# Patient Record
Sex: Female | Born: 1945 | Race: White | Hispanic: No | Marital: Married
Health system: Southern US, Community
[De-identification: ages and names within clinical notes are randomized; demographics above are authoritative.]

---

## 2014-03-07 ENCOUNTER — Other Ambulatory Visit (HOSPITAL_COMMUNITY): Payer: Self-pay

## 2014-03-07 ENCOUNTER — Inpatient Hospital Stay
Admission: AD | Admit: 2014-03-07 | Discharge: 2014-03-28 | Disposition: A | Discharge status: Skilled Nursing Facility | Payer: Self-pay | Source: Ambulatory Visit | Attending: Internal Medicine | Admitting: Internal Medicine

## 2014-03-07 DIAGNOSIS — R1310 Dysphagia, unspecified: Secondary | ICD-10-CM

## 2014-03-07 DIAGNOSIS — E46 Unspecified protein-calorie malnutrition: Secondary | ICD-10-CM

## 2014-03-07 LAB — COMPREHENSIVE METABOLIC PANEL
ALT: 58 U/L — AB (ref 0–35)
AST: 45 U/L — ABNORMAL HIGH (ref 0–37)
Albumin: 3.1 g/dL — ABNORMAL LOW (ref 3.5–5.2)
Alkaline Phosphatase: 239 U/L — ABNORMAL HIGH (ref 39–117)
BUN: 30 mg/dL — AB (ref 6–23)
CALCIUM: 9.8 mg/dL (ref 8.4–10.5)
CO2: 35 meq/L — AB (ref 19–32)
Chloride: 98 mEq/L (ref 96–112)
Creatinine, Ser: 0.44 mg/dL — ABNORMAL LOW (ref 0.50–1.10)
GLUCOSE: 117 mg/dL — AB (ref 70–99)
Potassium: 4.1 mEq/L (ref 3.7–5.3)
SODIUM: 144 meq/L (ref 137–147)
TOTAL PROTEIN: 7.1 g/dL (ref 6.0–8.3)
Total Bilirubin: 1.4 mg/dL — ABNORMAL HIGH (ref 0.3–1.2)

## 2014-03-07 LAB — PROTIME-INR
INR: 1.15 (ref 0.00–1.49)
PROTHROMBIN TIME: 14.5 s (ref 11.6–15.2)

## 2014-03-07 LAB — CBC WITH DIFFERENTIAL/PLATELET
Basophils Absolute: 0 10*3/uL (ref 0.0–0.1)
Basophils Relative: 0 % (ref 0–1)
EOS PCT: 1 % (ref 0–5)
Eosinophils Absolute: 0.1 10*3/uL (ref 0.0–0.7)
HEMATOCRIT: 45.7 % (ref 36.0–46.0)
Hemoglobin: 15.2 g/dL — ABNORMAL HIGH (ref 12.0–15.0)
LYMPHS ABS: 1.2 10*3/uL (ref 0.7–4.0)
Lymphocytes Relative: 13 % (ref 12–46)
MCH: 29 pg (ref 26.0–34.0)
MCHC: 33.3 g/dL (ref 30.0–36.0)
MCV: 87.2 fL (ref 78.0–100.0)
Monocytes Absolute: 0.8 10*3/uL (ref 0.1–1.0)
Monocytes Relative: 9 % (ref 3–12)
Neutro Abs: 7.2 10*3/uL (ref 1.7–7.7)
Neutrophils Relative %: 77 % (ref 43–77)
PLATELETS: 226 10*3/uL (ref 150–400)
RBC: 5.24 MIL/uL — AB (ref 3.87–5.11)
RDW: 17.9 % — ABNORMAL HIGH (ref 11.5–15.5)
WBC: 9.3 10*3/uL (ref 4.0–10.5)

## 2014-03-07 LAB — MAGNESIUM: Magnesium: 2.5 mg/dL (ref 1.5–2.5)

## 2014-03-07 LAB — APTT: aPTT: 25 seconds (ref 24–37)

## 2014-03-07 LAB — PHOSPHORUS: Phosphorus: 3 mg/dL (ref 2.3–4.6)

## 2014-03-07 LAB — PROCALCITONIN: PROCALCITONIN: 0.14 ng/mL

## 2014-03-07 LAB — PRO B NATRIURETIC PEPTIDE: PRO B NATRI PEPTIDE: 2892 pg/mL — AB (ref 0–125)

## 2014-03-07 LAB — TSH: TSH: 0.064 u[IU]/mL — AB (ref 0.350–4.500)

## 2014-03-08 LAB — HEMOGLOBIN A1C
Hgb A1c MFr Bld: 6.8 % — ABNORMAL HIGH (ref <5.7)
Mean Plasma Glucose: 148 mg/dL — ABNORMAL HIGH (ref <117)

## 2014-03-08 LAB — T4, FREE: FREE T4: 1.51 ng/dL (ref 0.80–1.80)

## 2014-03-08 LAB — PREALBUMIN: PREALBUMIN: 19.8 mg/dL (ref 17.0–34.0)

## 2014-03-10 LAB — BASIC METABOLIC PANEL
BUN: 23 mg/dL (ref 6–23)
CALCIUM: 8.8 mg/dL (ref 8.4–10.5)
CO2: 36 meq/L — AB (ref 19–32)
Chloride: 100 mEq/L (ref 96–112)
Creatinine, Ser: 0.39 mg/dL — ABNORMAL LOW (ref 0.50–1.10)
GFR calc Af Amer: >90 mL/min (ref >90)
GFR calc non Af Amer: >90 mL/min (ref >90)
Glucose, Bld: 157 mg/dL — ABNORMAL HIGH (ref 70–99)
Potassium: 3.6 mEq/L — ABNORMAL LOW (ref 3.7–5.3)
Sodium: 146 mEq/L (ref 137–147)

## 2014-03-10 LAB — CBC
HCT: 41.8 % (ref 36.0–46.0)
Hemoglobin: 13.5 g/dL (ref 12.0–15.0)
MCH: 28.4 pg (ref 26.0–34.0)
MCHC: 32.3 g/dL (ref 30.0–36.0)
MCV: 88 fL (ref 78.0–100.0)
Platelets: 164 10*3/uL (ref 150–400)
RBC: 4.75 MIL/uL (ref 3.87–5.11)
RDW: 17.8 % — AB (ref 11.5–15.5)
WBC: 9.4 10*3/uL (ref 4.0–10.5)

## 2014-03-11 ENCOUNTER — Other Ambulatory Visit (HOSPITAL_COMMUNITY): Payer: Self-pay

## 2014-03-11 LAB — URINALYSIS, ROUTINE W REFLEX MICROSCOPIC
BILIRUBIN URINE: NEGATIVE
Glucose, UA: NEGATIVE mg/dL
Ketones, ur: NEGATIVE mg/dL
NITRITE: NEGATIVE
PH: 7 (ref 5.0–8.0)
Protein, ur: NEGATIVE mg/dL
Specific Gravity, Urine: 1.014 (ref 1.005–1.030)
Urobilinogen, UA: 1 mg/dL (ref 0.0–1.0)

## 2014-03-11 LAB — URINE MICROSCOPIC-ADD ON

## 2014-03-11 LAB — POTASSIUM: Potassium: 3.6 mEq/L — ABNORMAL LOW (ref 3.7–5.3)

## 2014-03-12 ENCOUNTER — Other Ambulatory Visit (HOSPITAL_COMMUNITY): Payer: Self-pay

## 2014-03-12 LAB — CBC WITH DIFFERENTIAL/PLATELET
Basophils Absolute: 0 10*3/uL (ref 0.0–0.1)
Basophils Relative: 1 % (ref 0–1)
Eosinophils Absolute: 0.1 10*3/uL (ref 0.0–0.7)
Eosinophils Relative: 1 % (ref 0–5)
HCT: 43.6 % (ref 36.0–46.0)
HEMOGLOBIN: 13.7 g/dL (ref 12.0–15.0)
Lymphocytes Relative: 15 % (ref 12–46)
Lymphs Abs: 1.2 10*3/uL (ref 0.7–4.0)
MCH: 28.2 pg (ref 26.0–34.0)
MCHC: 31.4 g/dL (ref 30.0–36.0)
MCV: 89.7 fL (ref 78.0–100.0)
MONOS PCT: 10 % (ref 3–12)
Monocytes Absolute: 0.8 10*3/uL (ref 0.1–1.0)
Neutro Abs: 6.3 10*3/uL (ref 1.7–7.7)
Neutrophils Relative %: 73 % (ref 43–77)
Platelets: 198 10*3/uL (ref 150–400)
RBC: 4.86 MIL/uL (ref 3.87–5.11)
RDW: 17.8 % — ABNORMAL HIGH (ref 11.5–15.5)
WBC: 8.4 10*3/uL (ref 4.0–10.5)

## 2014-03-12 LAB — BASIC METABOLIC PANEL
BUN: 28 mg/dL — AB (ref 6–23)
CALCIUM: 9 mg/dL (ref 8.4–10.5)
CO2: 36 mEq/L — ABNORMAL HIGH (ref 19–32)
Chloride: 97 mEq/L (ref 96–112)
Creatinine, Ser: 0.47 mg/dL — ABNORMAL LOW (ref 0.50–1.10)
GFR calc Af Amer: >90 mL/min (ref >90)
GLUCOSE: 171 mg/dL — AB (ref 70–99)
Potassium: 4.6 mEq/L (ref 3.7–5.3)
Sodium: 142 mEq/L (ref 137–147)

## 2014-03-12 LAB — PROCALCITONIN: Procalcitonin: <0.1 ng/mL

## 2014-03-13 ENCOUNTER — Other Ambulatory Visit (HOSPITAL_COMMUNITY): Payer: Self-pay

## 2014-03-13 LAB — URINE CULTURE: Colony Count: >=100000

## 2014-03-14 ENCOUNTER — Other Ambulatory Visit (HOSPITAL_COMMUNITY): Payer: Self-pay

## 2014-03-14 LAB — BASIC METABOLIC PANEL
BUN: 28 mg/dL — AB (ref 6–23)
BUN: 29 mg/dL — AB (ref 6–23)
CALCIUM: 9.1 mg/dL (ref 8.4–10.5)
CHLORIDE: 94 meq/L — AB (ref 96–112)
CO2: 36 mEq/L — ABNORMAL HIGH (ref 19–32)
CO2: 34 mEq/L — ABNORMAL HIGH (ref 19–32)
CREATININE: 0.53 mg/dL (ref 0.50–1.10)
Calcium: 9.4 mg/dL (ref 8.4–10.5)
Chloride: 94 mEq/L — ABNORMAL LOW (ref 96–112)
Creatinine, Ser: 0.45 mg/dL — ABNORMAL LOW (ref 0.50–1.10)
GFR calc Af Amer: >90 mL/min (ref >90)
GFR calc Af Amer: >90 mL/min (ref >90)
GFR calc non Af Amer: >90 mL/min (ref >90)
GFR calc non Af Amer: >90 mL/min (ref >90)
GLUCOSE: 94 mg/dL (ref 70–99)
Glucose, Bld: 216 mg/dL — ABNORMAL HIGH (ref 70–99)
POTASSIUM: 5.4 meq/L — AB (ref 3.7–5.3)
Potassium: 4.2 mEq/L (ref 3.7–5.3)
Sodium: 141 mEq/L (ref 137–147)
Sodium: 140 mEq/L (ref 137–147)

## 2014-03-16 LAB — PRO B NATRIURETIC PEPTIDE: PRO B NATRI PEPTIDE: 3756 pg/mL — AB (ref 0–125)

## 2014-03-16 LAB — CBC
HCT: 43.5 % (ref 36.0–46.0)
HEMOGLOBIN: 13.6 g/dL (ref 12.0–15.0)
MCH: 27.8 pg (ref 26.0–34.0)
MCHC: 31.3 g/dL (ref 30.0–36.0)
MCV: 88.8 fL (ref 78.0–100.0)
PLATELETS: 225 10*3/uL (ref 150–400)
RBC: 4.9 MIL/uL (ref 3.87–5.11)
RDW: 17.8 % — ABNORMAL HIGH (ref 11.5–15.5)
WBC: 6.8 10*3/uL (ref 4.0–10.5)

## 2014-03-16 LAB — BASIC METABOLIC PANEL
BUN: 23 mg/dL (ref 6–23)
CALCIUM: 9.5 mg/dL (ref 8.4–10.5)
CO2: 32 meq/L (ref 19–32)
Chloride: 97 mEq/L (ref 96–112)
Creatinine, Ser: 0.65 mg/dL (ref 0.50–1.10)
GFR calc Af Amer: >90 mL/min (ref >90)
GFR calc non Af Amer: >90 mL/min (ref >90)
Glucose, Bld: 111 mg/dL — ABNORMAL HIGH (ref 70–99)
Potassium: 3.9 mEq/L (ref 3.7–5.3)
Sodium: 144 mEq/L (ref 137–147)

## 2014-03-16 LAB — PHOSPHORUS: Phosphorus: 3.4 mg/dL (ref 2.3–4.6)

## 2014-03-16 LAB — MAGNESIUM: Magnesium: 2.1 mg/dL (ref 1.5–2.5)

## 2014-03-18 LAB — PREALBUMIN: Prealbumin: 14.7 mg/dL — ABNORMAL LOW (ref 17.0–34.0)

## 2014-03-18 LAB — CBC WITH DIFFERENTIAL/PLATELET
BASOS ABS: 0 10*3/uL (ref 0.0–0.1)
BASOS PCT: 0 % (ref 0–1)
Eosinophils Absolute: 0.1 10*3/uL (ref 0.0–0.7)
Eosinophils Relative: 1 % (ref 0–5)
HCT: 43.2 % (ref 36.0–46.0)
Hemoglobin: 13.5 g/dL (ref 12.0–15.0)
LYMPHS PCT: 18 % (ref 12–46)
Lymphs Abs: 1 10*3/uL (ref 0.7–4.0)
MCH: 27.7 pg (ref 26.0–34.0)
MCHC: 31.3 g/dL (ref 30.0–36.0)
MCV: 88.7 fL (ref 78.0–100.0)
MONOS PCT: 12 % (ref 3–12)
Monocytes Absolute: 0.7 10*3/uL (ref 0.1–1.0)
NEUTROS ABS: 3.8 10*3/uL (ref 1.7–7.7)
NEUTROS PCT: 69 % (ref 43–77)
Platelets: 222 10*3/uL (ref 150–400)
RBC: 4.87 MIL/uL (ref 3.87–5.11)
RDW: 18.3 % — ABNORMAL HIGH (ref 11.5–15.5)
WBC: 5.6 10*3/uL (ref 4.0–10.5)

## 2014-03-18 LAB — BASIC METABOLIC PANEL
BUN: 16 mg/dL (ref 6–23)
CHLORIDE: 96 meq/L (ref 96–112)
CO2: 34 mEq/L — ABNORMAL HIGH (ref 19–32)
Calcium: 9.1 mg/dL (ref 8.4–10.5)
Creatinine, Ser: 0.64 mg/dL (ref 0.50–1.10)
Glucose, Bld: 104 mg/dL — ABNORMAL HIGH (ref 70–99)
Potassium: 3.4 mEq/L — ABNORMAL LOW (ref 3.7–5.3)
SODIUM: 143 meq/L (ref 137–147)

## 2014-03-18 LAB — MAGNESIUM: MAGNESIUM: 2.1 mg/dL (ref 1.5–2.5)

## 2014-03-18 LAB — PHOSPHORUS: PHOSPHORUS: 3.2 mg/dL (ref 2.3–4.6)

## 2014-03-19 LAB — BASIC METABOLIC PANEL
BUN: 14 mg/dL (ref 6–23)
CALCIUM: 9.5 mg/dL (ref 8.4–10.5)
CO2: 35 mEq/L — ABNORMAL HIGH (ref 19–32)
Chloride: 97 mEq/L (ref 96–112)
Creatinine, Ser: 0.6 mg/dL (ref 0.50–1.10)
GFR calc non Af Amer: >90 mL/min (ref >90)
Glucose, Bld: 92 mg/dL (ref 70–99)
Potassium: 3.7 mEq/L (ref 3.7–5.3)
SODIUM: 144 meq/L (ref 137–147)

## 2014-03-21 ENCOUNTER — Other Ambulatory Visit (HOSPITAL_COMMUNITY): Payer: Self-pay

## 2014-03-21 LAB — URINALYSIS, ROUTINE W REFLEX MICROSCOPIC
BILIRUBIN URINE: NEGATIVE
Glucose, UA: NEGATIVE mg/dL
HGB URINE DIPSTICK: NEGATIVE
Ketones, ur: NEGATIVE mg/dL
Nitrite: NEGATIVE
Protein, ur: NEGATIVE mg/dL
Specific Gravity, Urine: 1.023 (ref 1.005–1.030)
Urobilinogen, UA: 1 mg/dL (ref 0.0–1.0)
pH: 6 (ref 5.0–8.0)

## 2014-03-21 LAB — CBC WITH DIFFERENTIAL/PLATELET
BASOS ABS: 0 10*3/uL (ref 0.0–0.1)
Basophils Relative: 0 % (ref 0–1)
Eosinophils Absolute: 0.1 10*3/uL (ref 0.0–0.7)
Eosinophils Relative: 1 % (ref 0–5)
HCT: 44.7 % (ref 36.0–46.0)
Hemoglobin: 14.9 g/dL (ref 12.0–15.0)
LYMPHS PCT: 9 % — AB (ref 12–46)
Lymphs Abs: 1 10*3/uL (ref 0.7–4.0)
MCH: 28.9 pg (ref 26.0–34.0)
MCHC: 33.3 g/dL (ref 30.0–36.0)
MCV: 86.6 fL (ref 78.0–100.0)
MONOS PCT: 9 % (ref 3–12)
Monocytes Absolute: 1 10*3/uL (ref 0.1–1.0)
NEUTROS PCT: 81 % — AB (ref 43–77)
Neutro Abs: 9.3 10*3/uL — ABNORMAL HIGH (ref 1.7–7.7)
PLATELETS: 160 10*3/uL (ref 150–400)
RBC: 5.16 MIL/uL — ABNORMAL HIGH (ref 3.87–5.11)
RDW: 18.8 % — ABNORMAL HIGH (ref 11.5–15.5)
WBC: 11.4 10*3/uL — AB (ref 4.0–10.5)

## 2014-03-21 LAB — BASIC METABOLIC PANEL
BUN: 11 mg/dL (ref 6–23)
CHLORIDE: 95 meq/L — AB (ref 96–112)
CO2: 34 mEq/L — ABNORMAL HIGH (ref 19–32)
CREATININE: 0.52 mg/dL (ref 0.50–1.10)
Calcium: 9.2 mg/dL (ref 8.4–10.5)
GFR calc non Af Amer: >90 mL/min (ref >90)
Glucose, Bld: 92 mg/dL (ref 70–99)
POTASSIUM: 4.6 meq/L (ref 3.7–5.3)
Sodium: 141 mEq/L (ref 137–147)

## 2014-03-21 LAB — URINE MICROSCOPIC-ADD ON

## 2014-03-21 LAB — PHOSPHORUS: Phosphorus: 2.4 mg/dL (ref 2.3–4.6)

## 2014-03-21 LAB — PRO B NATRIURETIC PEPTIDE: PRO B NATRI PEPTIDE: 2481 pg/mL — AB (ref 0–125)

## 2014-03-21 LAB — MAGNESIUM: Magnesium: 2.1 mg/dL (ref 1.5–2.5)

## 2014-03-22 LAB — BASIC METABOLIC PANEL
BUN: 14 mg/dL (ref 6–23)
CO2: 31 mEq/L (ref 19–32)
Calcium: 9.1 mg/dL (ref 8.4–10.5)
Chloride: 95 mEq/L — ABNORMAL LOW (ref 96–112)
Creatinine, Ser: 0.46 mg/dL — ABNORMAL LOW (ref 0.50–1.10)
GFR calc Af Amer: >90 mL/min (ref >90)
GFR calc non Af Amer: >90 mL/min (ref >90)
GLUCOSE: 85 mg/dL (ref 70–99)
POTASSIUM: 3.8 meq/L (ref 3.7–5.3)
Sodium: 138 mEq/L (ref 137–147)

## 2014-03-22 LAB — CBC
HCT: 43.3 % (ref 36.0–46.0)
HEMOGLOBIN: 14.2 g/dL (ref 12.0–15.0)
MCH: 28.5 pg (ref 26.0–34.0)
MCHC: 32.8 g/dL (ref 30.0–36.0)
MCV: 86.8 fL (ref 78.0–100.0)
Platelets: 164 10*3/uL (ref 150–400)
RBC: 4.99 MIL/uL (ref 3.87–5.11)
RDW: 19.1 % — ABNORMAL HIGH (ref 11.5–15.5)
WBC: 9.7 10*3/uL (ref 4.0–10.5)

## 2014-03-22 LAB — PROTIME-INR
INR: 1.21 (ref 0.00–1.49)
Prothrombin Time: 15 seconds (ref 11.6–15.2)

## 2014-03-23 LAB — URINE CULTURE: Colony Count: 50000

## 2014-03-23 NOTE — Progress Notes (Signed)
Patient ID: Jade Morgan, female   DOB: [birth year], 68 y.o.   MRN: [id number] Request received for percutaneous gastrostomy tube placement in pt with history of PCM, dysphagia. Additional PMH includes CM,CHF, EF 20-25%, A fib,? narcolepsy,  HTN, remote CVA, GI bleed in past- hemorrhoids per pt, ICD placement, prior hyst., appy, cholecystectomy. Imaging studies were reviewed by Dr. Bonnielee Haff. Exam: pt awake, responds appropriately to questions; chest- dim BS bases, right greater than left; left chest wall ICD; heart- reg at present; abd- soft,+BS,NT; ext- edema bilat LE.  Vitals: BP 110/57  HR 62  R 18 TEMP 98.6  O2 SATS 98% RA                                                                                                     Dg Chest Port 1 View  03/21/2014   CLINICAL DATA:  Pneumonia.  EXAM: PORTABLE CHEST - 1 VIEW  COMPARISON:  Mar 07, 2014.  FINDINGS: Stable cardiomegaly. Left-sided pacemaker is unchanged in position. Mild left pleural effusion is noted. Moderate right pleural effusion is noted which is increased compared to prior exam. Underlying pneumonia or atelectasis cannot be excluded. No pneumothorax is noted. Bony thorax is intact.  IMPRESSION: Moderate right pleural effusion is noted which is increased compared to prior exam; underlying pneumonia or atelectasis cannot be excluded.   Electronically Signed   By: Roque Lias M.D.   On: 03/21/2014 12:43   Dg Chest Port 1 View  03/07/2014   CLINICAL DATA:  CHF.  EXAM: PORTABLE CHEST - 1 VIEW  COMPARISON:  None.  FINDINGS: Enlarged cardiac silhouette. Left subclavian pacer and AICD leads. Right jugular catheter tip in the region of the confluence of the innominate veins. Feeding tube extending into the upper abdomen. Mildly prominent interstitial markings and pulmonary vasculature. Minimal bilateral pleural thickening or fluid. Diffuse osteopenia.  IMPRESSION: 1. Cardiomegaly and pulmonary vascular congestion. 2. Mild chronic interstitial lung disease and  possible mild interstitial pulmonary edema. 3. Minimal bilateral pleural thickening or fluid. 4. Right jugular catheter tip in the region of the confluence of the innominate veins.   Electronically Signed   By: Gordan Payment M.D.   On: 03/07/2014 19:32   Dg Ankle Right Port  03/14/2014   CLINICAL DATA:  Pain at base of right great toe.  EXAM: PORTABLE RIGHT ANKLE - 2 VIEW  COMPARISON:  None.  FINDINGS: No acute fracture or dislocation is identified. Joint spaces are preserved. No lytic or blastic osseous lesion is seen. No focal soft tissue abnormality is identified.  IMPRESSION: Negative.   Electronically Signed   By: Sebastian Ache   On: 03/14/2014 17:03   Dg Abd Portable 1v  03/07/2014   CLINICAL DATA:  Confirmed NG placement  EXAM: PORTABLE ABDOMEN - 1 VIEW  COMPARISON:  None.  FINDINGS: Weighted feeding tube terminates in the proximal duodenum.  Cholecystectomy clips.  Cardiomegaly with left subclavian ICD.  IMPRESSION: Weighted feeding tube terminates in the proximal duodenum.   Electronically Signed   By: Charline Bills M.D.   On: 03/07/2014 20:59  Dg Foot Complete Right  03/14/2014   CLINICAL DATA:  Right great toe pain  EXAM: RIGHT FOOT COMPLETE - 3+ VIEW  COMPARISON:  None.  FINDINGS: Three views of the right foot submitted. No acute fracture or subluxation. Mild erosive degenerative changes noted distal medial aspect of first metatarsal.  IMPRESSION: No acute fracture or subluxation. 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   Electronically Signed   By: Natasha MeadLiviu  Pop M.D.   On: 03/14/2014 17:00  Results for orders placed during the hospital encounter of 03/07/14  URINE CULTURE      Result Value Ref Range   Specimen Description URINE, RANDOM     Special Requests NONE     Culture  Setup Time       Value: 03/11/2014 15:00     Performed at [facility]   Colony Count       Value: >=100,000 COLONIES/ML     [facility]formed at [facility]   Culture        Value: ENTEROCOCCUS SPECIES     Performed at [facility]   Report Status 03/13/2014 FINAL     Organism ID, Bacteria ENTEROCOCCUS SPECIES    URINE CULTURE      Result Value Ref Range   Specimen Description URINE, RANDOM     Special Requests ADDED [id number]     Culture  Setup Time       Value: 03/21/2014 21:35     Performed at [facility]   Colony Count       Value: 50,000 COLONIES/ML     Performed at [facility]   Culture       Value: YEAST     Performed at [facility]   Report Status 03/23/2014 FINAL    CBC WITH DIFFERENTIAL      Result Value Ref Range   WBC 9.3  4.0 - 10.5 K/uL   RBC 5.24 (*) 3.87 - 5.11 MIL/uL   Hemoglobin 15.2 (*) 12.0 - 15.0 g/dL   HCT 04.545.7  40.936.0 - 81.146.0 %   MCV 87.2  78.0 - 100.0 fL   MCH 29.0  26.0 - 34.0 pg   MCHC 33.3  30.0 - 36.0 g/dL   RDW 91.417.9 (*) 78.211.5 - 95.615.5 %   Platelets 226  150 - 400 K/uL   Neutrophils Relative % 77  43 - 77 %   Neutro Abs 7.2  1.7 - 7.7 K/uL   Lymphocytes Relative 13  12 - 46 %   Lymphs Abs 1.2  0.7 - 4.0 K/uL   Monocytes Relative 9  3 - 12 %   Monocytes Absolute 0.8  0.1 - 1.0 K/uL   Eosinophils Relative 1  0 - 5 %   Eosinophils Absolute 0.1  0.0 - 0.7 K/uL   Basophils Relative 0  0 - 1 %   Basophils Absolute 0.0  0.0 - 0.1 K/uL  COMPREHENSIVE METABOLIC PANEL      Result Value Ref Range   Sodium 144  137 - 147 mEq/L   Potassium 4.1  3.7 - 5.3 mEq/L   Chloride 98  96 - 112 mEq/L   CO2 35 (*) 19 - 32 mEq/L   Glucose, Bld 117 (*) 70 - 99 mg/dL   BUN 30 (*) 6 - 23 mg/dL   Creatinine, Ser 2.130.44 (*) 0.50 - 1.10 mg/dL   Calcium 9.8  8.4 - 08.610.5 mg/dL   Total Protein 7.1  6.0 - 8.3  g/dL   Albumin 3.1 (*) 3.5 - 5.2 g/dL   AST 45 (*) 0 - 37 U/L   ALT 58 (*) 0 - 35 U/L   Alkaline Phosphatase 239 (*) 39 - 117 U/L   Total Bilirubin 1.4 (*) 0.3 - 1.2 mg/dL   GFR calc non Af Amer >90  >90 mL/min   GFR calc Af Amer >90  >90 mL/min  MAGNESIUM      Result Value Ref Range   Magnesium 2.5  1.5  - 2.5 mg/dL  PHOSPHORUS      Result Value Ref Range   Phosphorus 3.0  2.3 - 4.6 mg/dL  PRO B NATRIURETIC PEPTIDE      Result Value Ref Range   Pro B Natriuretic peptide (BNP) 2892.0 (*) 0 - 125 pg/mL  PROTIME-INR      Result Value Ref Range   Prothrombin Time 14.5  11.6 - 15.2 seconds   INR 1.15  0.00 - 1.49  APTT      Result Value Ref Range   aPTT 25  24 - 37 seconds  PREALBUMIN      Result Value Ref Range   Prealbumin 19.8  17.0 - 34.0 mg/dL  HEMOGLOBIN Z6X      Result Value Ref Range   Hemoglobin A1C 6.8 (*) <5.7 %   Mean Plasma Glucose 148 (*) <117 mg/dL  TSH      Result Value Ref Range   TSH 0.064 (*) 0.350 - 4.500 uIU/mL  T4, FREE      Result Value Ref Range   Free T4 1.51  0.80 - 1.80 ng/dL  PROCALCITONIN      Result Value Ref Range   Procalcitonin 0.14    CBC      Result Value Ref Range   WBC 9.4  4.0 - 10.5 K/uL   RBC 4.75  3.87 - 5.11 MIL/uL   Hemoglobin 13.5  12.0 - 15.0 g/dL   HCT 09.6  04.5 - 40.9 %   MCV 88.0  78.0 - 100.0 fL   MCH 28.4  26.0 - 34.0 pg   MCHC 32.3  30.0 - 36.0 g/dL   RDW 81.1 (*) 91.4 - 78.2 %   Platelets 164  150 - 400 K/uL  BASIC METABOLIC PANEL      Result Value Ref Range   Sodium 146  137 - 147 mEq/L   Potassium 3.6 (*) 3.7 - 5.3 mEq/L   Chloride 100  96 - 112 mEq/L   CO2 36 (*) 19 - 32 mEq/L   Glucose, Bld 157 (*) 70 - 99 mg/dL   BUN 23  6 - 23 mg/dL   Creatinine, Ser 9.56 (*) 0.50 - 1.10 mg/dL   Calcium 8.8  8.4 - 21.3 mg/dL   GFR calc non Af Amer >90  >90 mL/min   GFR calc Af Amer >90  >90 mL/min  POTASSIUM      Result Value Ref Range   Potassium 3.6 (*) 3.7 - 5.3 mEq/L  URINALYSIS, ROUTINE W REFLEX MICROSCOPIC      Result Value Ref Range   Color, Urine YELLOW  YELLOW   APPearance CLOUDY (*) CLEAR   Specific Gravity, Urine 1.014  1.005 - 1.030   pH 7.0  5.0 - 8.0   Glucose, UA NEGATIVE  NEGATIVE mg/dL   Hgb urine dipstick LARGE (*) NEGATIVE   Bilirubin Urine NEGATIVE  NEGATIVE   Ketones, ur NEGATIVE  NEGATIVE mg/dL    Protein, ur NEGATIVE  NEGATIVE mg/dL  Urobilinogen, UA 1.0  0.0 - 1.0 mg/dL   Nitrite NEGATIVE  NEGATIVE   Leukocytes, UA MODERATE (*) NEGATIVE  URINE MICROSCOPIC-ADD ON      Result Value Ref Range   Squamous Epithelial / LPF RARE  RARE   WBC, UA 7-10  <3 WBC/hpf   RBC / HPF 21-50  <3 RBC/hpf   Bacteria, UA MANY (*) RARE   Casts HYALINE CASTS (*) NEGATIVE  CBC WITH DIFFERENTIAL      Result Value Ref Range   WBC 8.4  4.0 - 10.5 K/uL   RBC 4.86  3.87 - 5.11 MIL/uL   Hemoglobin 13.7  12.0 - 15.0 g/dL   HCT 16.143.6  09.636.0 - 04.546.0 %   MCV 89.7  78.0 - 100.0 fL   MCH 28.2  26.0 - 34.0 pg   MCHC 31.4  30.0 - 36.0 g/dL   RDW 40.917.8 (*) 81.111.5 - 91.415.5 %   Platelets 198  150 - 400 K/uL   Neutrophils Relative % 73  43 - 77 %   Neutro Abs 6.3  1.7 - 7.7 K/uL   Lymphocytes Relative 15  12 - 46 %   Lymphs Abs 1.2  0.7 - 4.0 K/uL   Monocytes Relative 10  3 - 12 %   Monocytes Absolute 0.8  0.1 - 1.0 K/uL   Eosinophils Relative 1  0 - 5 %   Eosinophils Absolute 0.1  0.0 - 0.7 K/uL   Basophils Relative 1  0 - 1 %   Basophils Absolute 0.0  0.0 - 0.1 K/uL  BASIC METABOLIC PANEL      Result Value Ref Range   Sodium 142  137 - 147 mEq/L   Potassium 4.6  3.7 - 5.3 mEq/L   Chloride 97  96 - 112 mEq/L   CO2 36 (*) 19 - 32 mEq/L   Glucose, Bld 171 (*) 70 - 99 mg/dL   BUN 28 (*) 6 - 23 mg/dL   Creatinine, Ser 7.820.47 (*) 0.50 - 1.10 mg/dL   Calcium 9.0  8.4 - 95.610.5 mg/dL   GFR calc non Af Amer >90  >90 mL/min   GFR calc Af Amer >90  >90 mL/min  PROCALCITONIN      Result Value Ref Range   Procalcitonin <0.10    BASIC METABOLIC PANEL      Result Value Ref Range   Sodium 141  137 - 147 mEq/L   Potassium 5.4 (*) 3.7 - 5.3 mEq/L   Chloride 94 (*) 96 - 112 mEq/L   CO2 36 (*) 19 - 32 mEq/L   Glucose, Bld 94  70 - 99 mg/dL   BUN 28 (*) 6 - 23 mg/dL   Creatinine, Ser 2.130.45 (*) 0.50 - 1.10 mg/dL   Calcium 9.1  8.4 - 08.610.5 mg/dL   GFR calc non Af Amer >90  >90 mL/min   GFR calc Af Amer >90  >90 mL/min   BASIC METABOLIC PANEL      Result Value Ref Range   Sodium 140  137 - 147 mEq/L   Potassium 4.2  3.7 - 5.3 mEq/L   Chloride 94 (*) 96 - 112 mEq/L   CO2 34 (*) 19 - 32 mEq/L   Glucose, Bld 216 (*) 70 - 99 mg/dL   BUN 29 (*) 6 - 23 mg/dL   Creatinine, Ser 5.780.53  0.50 - 1.10 mg/dL   Calcium 9.4  8.4 - 46.910.5 mg/dL   GFR calc non Af Amer >90  >90  mL/min   GFR calc Af Amer >90  >90 mL/min  CBC      Result Value Ref Range   WBC 6.8  4.0 - 10.5 K/uL   RBC 4.90  3.87 - 5.11 MIL/uL   Hemoglobin 13.6  12.0 - 15.0 g/dL   HCT 16.1  09.6 - 04.5 %   MCV 88.8  78.0 - 100.0 fL   MCH 27.8  26.0 - 34.0 pg   MCHC 31.3  30.0 - 36.0 g/dL   RDW 40.9 (*) 81.1 - 91.4 %   Platelets 225  150 - 400 K/uL  BASIC METABOLIC PANEL      Result Value Ref Range   Sodium 144  137 - 147 mEq/L   Potassium 3.9  3.7 - 5.3 mEq/L   Chloride 97  96 - 112 mEq/L   CO2 32  19 - 32 mEq/L   Glucose, Bld 111 (*) 70 - 99 mg/dL   BUN 23  6 - 23 mg/dL   Creatinine, Ser 7.82  0.50 - 1.10 mg/dL   Calcium 9.5  8.4 - 95.6 mg/dL   GFR calc non Af Amer >90  >90 mL/min   GFR calc Af Amer >90  >90 mL/min  PRO B NATRIURETIC PEPTIDE      Result Value Ref Range   Pro B Natriuretic peptide (BNP) 3756.0 (*) 0 - 125 pg/mL  MAGNESIUM      Result Value Ref Range   Magnesium 2.1  1.5 - 2.5 mg/dL  PHOSPHORUS      Result Value Ref Range   Phosphorus 3.4  2.3 - 4.6 mg/dL  BASIC METABOLIC PANEL      Result Value Ref Range   Sodium 143  137 - 147 mEq/L   Potassium 3.4 (*) 3.7 - 5.3 mEq/L   Chloride 96  96 - 112 mEq/L   CO2 34 (*) 19 - 32 mEq/L   Glucose, Bld 104 (*) 70 - 99 mg/dL   BUN 16  6 - 23 mg/dL   Creatinine, Ser 2.13  0.50 - 1.10 mg/dL   Calcium 9.1  8.4 - 08.6 mg/dL   GFR calc non Af Amer >90  >90 mL/min   GFR calc Af Amer >90  >90 mL/min  CBC WITH DIFFERENTIAL      Result Value Ref Range   WBC 5.6  4.0 - 10.5 K/uL   RBC 4.87  3.87 - 5.11 MIL/uL   Hemoglobin 13.5  12.0 - 15.0 g/dL   HCT 57.8  46.9 - 62.9 %   MCV 88.7   78.0 - 100.0 fL   MCH 27.7  26.0 - 34.0 pg   MCHC 31.3  30.0 - 36.0 g/dL   RDW 52.8 (*) 41.3 - 24.4 %   Platelets 222  150 - 400 K/uL   Neutrophils Relative % 69  43 - 77 %   Neutro Abs 3.8  1.7 - 7.7 K/uL   Lymphocytes Relative 18  12 - 46 %   Lymphs Abs 1.0  0.7 - 4.0 K/uL   Monocytes Relative 12  3 - 12 %   Monocytes Absolute 0.7  0.1 - 1.0 K/uL   Eosinophils Relative 1  0 - 5 %   Eosinophils Absolute 0.1  0.0 - 0.7 K/uL   Basophils Relative 0  0 - 1 %   Basophils Absolute 0.0  0.0 - 0.1 K/uL  PREALBUMIN      Result Value Ref Range   Prealbumin 14.7 (*) 17.0 -  34.0 mg/dL  MAGNESIUM      Result Value Ref Range   Magnesium 2.1  1.5 - 2.5 mg/dL  PHOSPHORUS      Result Value Ref Range   Phosphorus 3.2  2.3 - 4.6 mg/dL  BASIC METABOLIC PANEL      Result Value Ref Range   Sodium 144  137 - 147 mEq/L   Potassium 3.7  3.7 - 5.3 mEq/L   Chloride 97  96 - 112 mEq/L   CO2 35 (*) 19 - 32 mEq/L   Glucose, Bld 92  70 - 99 mg/dL   BUN 14  6 - 23 mg/dL   Creatinine, Ser 1.61  0.50 - 1.10 mg/dL   Calcium 9.5  8.4 - 09.6 mg/dL   GFR calc non Af Amer >90  >90 mL/min   GFR calc Af Amer >90  >90 mL/min  CBC WITH DIFFERENTIAL      Result Value Ref Range   WBC 11.4 (*) 4.0 - 10.5 K/uL   RBC 5.16 (*) 3.87 - 5.11 MIL/uL   Hemoglobin 14.9  12.0 - 15.0 g/dL   HCT 04.5  40.9 - 81.1 %   MCV 86.6  78.0 - 100.0 fL   MCH 28.9  26.0 - 34.0 pg   MCHC 33.3  30.0 - 36.0 g/dL   RDW 91.4 (*) 78.2 - 95.6 %   Platelets 160  150 - 400 K/uL   Neutrophils Relative % 81 (*) 43 - 77 %   Lymphocytes Relative 9 (*) 12 - 46 %   Monocytes Relative 9  3 - 12 %   Eosinophils Relative 1  0 - 5 %   Basophils Relative 0  0 - 1 %   Neutro Abs 9.3 (*) 1.7 - 7.7 K/uL   Lymphs Abs 1.0  0.7 - 4.0 K/uL   Monocytes Absolute 1.0  0.1 - 1.0 K/uL   Eosinophils Absolute 0.1  0.0 - 0.7 K/uL   Basophils Absolute 0.0  0.0 - 0.1 K/uL   RBC Morphology ELLIPTOCYTES    BASIC METABOLIC PANEL      Result Value Ref Range    Sodium 141  137 - 147 mEq/L   Potassium 4.6  3.7 - 5.3 mEq/L   Chloride 95 (*) 96 - 112 mEq/L   CO2 34 (*) 19 - 32 mEq/L   Glucose, Bld 92  70 - 99 mg/dL   BUN 11  6 - 23 mg/dL   Creatinine, Ser 2.13  0.50 - 1.10 mg/dL   Calcium 9.2  8.4 - 08.6 mg/dL   GFR calc non Af Amer >90  >90 mL/min   GFR calc Af Amer >90  >90 mL/min  MAGNESIUM      Result Value Ref Range   Magnesium 2.1  1.5 - 2.5 mg/dL  PHOSPHORUS      Result Value Ref Range   Phosphorus 2.4  2.3 - 4.6 mg/dL  PRO B NATRIURETIC PEPTIDE      Result Value Ref Range   Pro B Natriuretic peptide (BNP) 2481.0 (*) 0 - 125 pg/mL  URINALYSIS, ROUTINE W REFLEX MICROSCOPIC      Result Value Ref Range   Color, Urine AMBER (*) YELLOW   APPearance HAZY (*) CLEAR   Specific Gravity, Urine 1.023  1.005 - 1.030   pH 6.0  5.0 - 8.0   Glucose, UA NEGATIVE  NEGATIVE mg/dL   Hgb urine dipstick NEGATIVE  NEGATIVE   Bilirubin Urine NEGATIVE  NEGATIVE   Ketones,  ur NEGATIVE  NEGATIVE mg/dL   Protein, ur NEGATIVE  NEGATIVE mg/dL   Urobilinogen, UA 1.0  0.0 - 1.0 mg/dL   Nitrite NEGATIVE  NEGATIVE   Leukocytes, UA SMALL (*) NEGATIVE  CBC      Result Value Ref Range   WBC 9.7  4.0 - 10.5 K/uL   RBC 4.99  3.87 - 5.11 MIL/uL   Hemoglobin 14.2  12.0 - 15.0 g/dL   HCT 40.9  81.1 - 91.4 %   MCV 86.8  78.0 - 100.0 fL   MCH 28.5  26.0 - 34.0 pg   MCHC 32.8  30.0 - 36.0 g/dL   RDW 78.2 (*) 95.6 - 21.3 %   Platelets 164  150 - 400 K/uL  BASIC METABOLIC PANEL      Result Value Ref Range   Sodium 138  137 - 147 mEq/L   Potassium 3.8  3.7 - 5.3 mEq/L   Chloride 95 (*) 96 - 112 mEq/L   CO2 31  19 - 32 mEq/L   Glucose, Bld 85  70 - 99 mg/dL   BUN 14  6 - 23 mg/dL   Creatinine, Ser 0.86 (*) 0.50 - 1.10 mg/dL   Calcium 9.1  8.4 - 57.8 mg/dL   GFR calc non Af Amer >90  >90 mL/min   GFR calc Af Amer >90  >90 mL/min  PROTIME-INR      Result Value Ref Range   Prothrombin Time 15.0  11.6 - 15.2 seconds   INR 1.21  0.00 - 1.49  URINE  MICROSCOPIC-ADD ON      Result Value Ref Range   Squamous Epithelial / LPF FEW (*) RARE   WBC, UA 3-6  <3 WBC/hpf   Bacteria, UA FEW (*) RARE   Casts HYALINE CASTS (*) NEGATIVE   Urine-Other MUCOUS PRESENT     A/P: Pt with history of PCM, dysphagia. Additional PMH includes CM,CHF, EF 20-25%, A fib,? narcolepsy,  HTN, remote CVA, GI bleed in past- hemorrhoids per pt- no interv/surg performed per pt/husband, ICD placement, prior hyst., appy, cholecystectomy. Tent plan is for per gastrostomy tube placement on 5/18. Details/risks of procedure d/w pt/husband with their understanding and consent.

## 2014-03-24 ENCOUNTER — Other Ambulatory Visit (HOSPITAL_COMMUNITY): Payer: Self-pay

## 2014-03-24 LAB — BASIC METABOLIC PANEL
BUN: 16 mg/dL (ref 6–23)
CALCIUM: 9.1 mg/dL (ref 8.4–10.5)
CO2: 30 meq/L (ref 19–32)
CREATININE: 0.44 mg/dL — AB (ref 0.50–1.10)
Chloride: 92 mEq/L — ABNORMAL LOW (ref 96–112)
GFR calc Af Amer: >90 mL/min (ref >90)
GFR calc non Af Amer: >90 mL/min (ref >90)
Glucose, Bld: 82 mg/dL (ref 70–99)
Potassium: 3.5 mEq/L — ABNORMAL LOW (ref 3.7–5.3)
Sodium: 136 mEq/L — ABNORMAL LOW (ref 137–147)

## 2014-03-24 LAB — PROTIME-INR
INR: 1.2 (ref 0.00–1.49)
PROTHROMBIN TIME: 14.9 s (ref 11.6–15.2)

## 2014-03-24 LAB — CBC
HCT: 44.5 % (ref 36.0–46.0)
Hemoglobin: 14.3 g/dL (ref 12.0–15.0)
MCH: 28.2 pg (ref 26.0–34.0)
MCHC: 32.1 g/dL (ref 30.0–36.0)
MCV: 87.8 fL (ref 78.0–100.0)
Platelets: 154 10*3/uL (ref 150–400)
RBC: 5.07 MIL/uL (ref 3.87–5.11)
RDW: 19.2 % — ABNORMAL HIGH (ref 11.5–15.5)
WBC: 6.8 10*3/uL (ref 4.0–10.5)

## 2014-03-24 LAB — APTT: aPTT: 28 seconds (ref 24–37)

## 2014-03-24 LAB — PREALBUMIN: Prealbumin: 12.5 mg/dL — ABNORMAL LOW (ref 17.0–34.0)

## 2014-03-24 MED ORDER — CEFAZOLIN SODIUM-DEXTROSE 2-3 GM-% IV SOLR
INTRAVENOUS | Status: AC
  Administered 2014-03-24: 2 g
  Filled 2014-03-24: qty 50

## 2014-03-24 MED ORDER — CEFAZOLIN (ANCEF) 1 G IV SOLR: 2.0000 g | INTRAVENOUS | Status: AC

## 2014-03-24 MED ORDER — MIDAZOLAM HCL 2 MG/2ML IJ SOLN
INTRAMUSCULAR | Status: AC
  Filled 2014-03-24: qty 4

## 2014-03-24 MED ORDER — FENTANYL CITRATE 0.05 MG/ML IJ SOLN
INTRAMUSCULAR | Status: AC | PRN
  Administered 2014-03-24 (×4): 25 ug via INTRAVENOUS

## 2014-03-24 MED ORDER — IOHEXOL 300 MG/ML  SOLN
50.0000 mL | Freq: Once | INTRAMUSCULAR | Status: AC | PRN
  Administered 2014-03-24: 1 mL via INTRAVENOUS

## 2014-03-24 MED ORDER — GLUCAGON HCL (RDNA) 1 MG IJ SOLR
INTRAMUSCULAR | Status: AC
  Filled 2014-03-24: qty 1

## 2014-03-24 MED ORDER — FENTANYL CITRATE 0.05 MG/ML IJ SOLN
INTRAMUSCULAR | Status: AC
  Filled 2014-03-24: qty 4

## 2014-03-24 MED ORDER — MIDAZOLAM HCL 2 MG/2ML IJ SOLN
INTRAMUSCULAR | Status: AC | PRN
  Administered 2014-03-24: 0.5 mg via INTRAVENOUS
  Administered 2014-03-24: 1 mg via INTRAVENOUS
  Administered 2014-03-24: 0.5 mg via INTRAVENOUS

## 2014-03-24 NOTE — Sedation Documentation (Signed)
Pt feeling nauseated, given cool wet cloth, suctioned with yankeur. No vomiting.

## 2014-03-24 NOTE — Procedures (Signed)
21F gastrostomy tube placement under fluoro No complication No blood loss. See complete dictation in [facility] P[facility].  

## 2014-03-24 NOTE — Sedation Documentation (Signed)
Pt placed back on 2LNC, instructed to take deep breaths

## 2014-03-26 LAB — BASIC METABOLIC PANEL
BUN: 21 mg/dL (ref 6–23)
CALCIUM: 8.9 mg/dL (ref 8.4–10.5)
CO2: 31 mEq/L (ref 19–32)
Chloride: 93 mEq/L — ABNORMAL LOW (ref 96–112)
Creatinine, Ser: 0.56 mg/dL (ref 0.50–1.10)
Glucose, Bld: 147 mg/dL — ABNORMAL HIGH (ref 70–99)
Potassium: 3.7 mEq/L (ref 3.7–5.3)
SODIUM: 136 meq/L — AB (ref 137–147)

## 2014-05-07 DEATH — deceased

## 2014-11-10 IMAGING — CR DG CHEST 1V PORT
1 series · 1 of 1 positions shown · non-contrast
Comparison: Portable exam 9222 hr compared to 03/21/2014

CLINICAL DATA: Respiratory failure

EXAM:
PORTABLE CHEST - 1 VIEW

[AP]
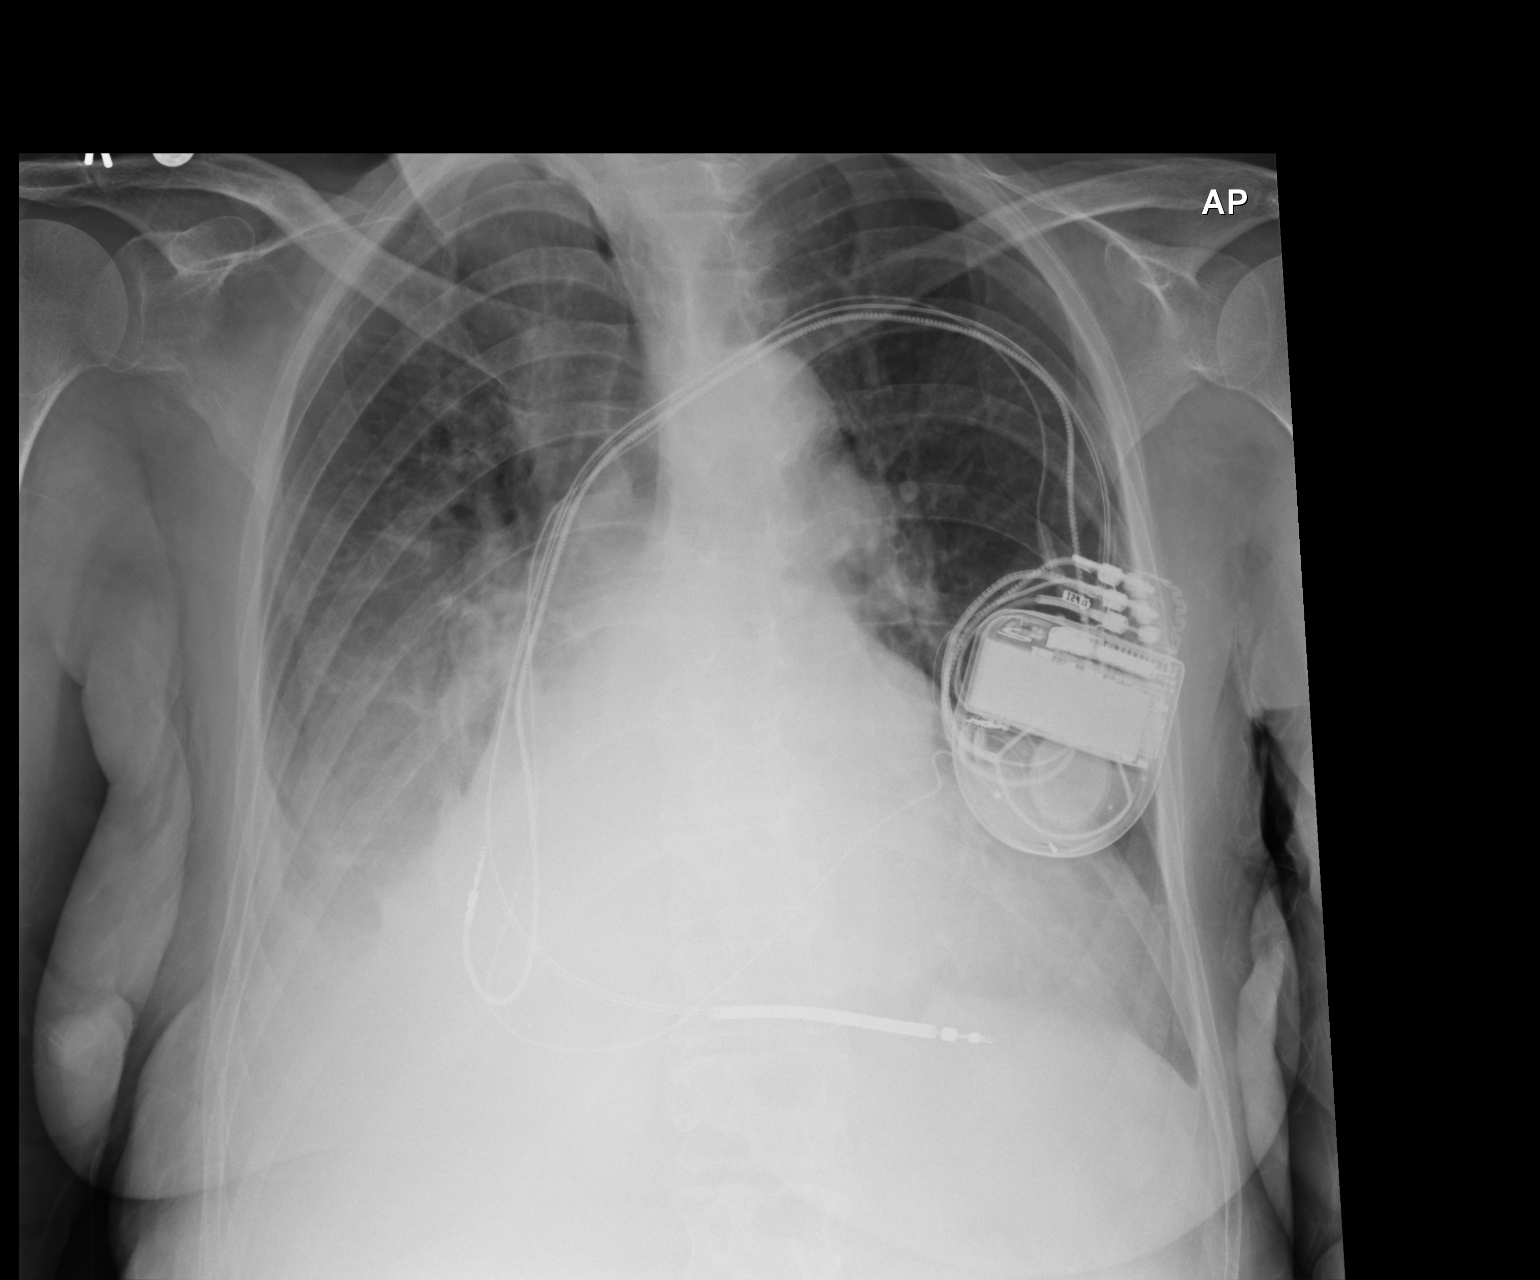

[1 of 1 positions shown; findings below may reference images not displayed]

FINDINGS: LEFT subclavian transvenous pacemaker/AICD leads project over RIGHT
atrium, RIGHT ventricle and coronary sinus.

Enlargement of cardiac silhouette.

Mediastinal contours and pulmonary vascularity normal.

Persistent RIGHT pleural effusion and basilar atelectasis.

Unable to exclude underlying consolidation in RIGHT lower lobe.

Remaining lungs grossly clear.

No pleural effusion or pneumothorax.

Bones demineralized.
IMPRESSION: Enlargement of cardiac silhouette post AICD.

Persistent RIGHT pleural effusion and basilar atelectasis, unable to
exclude underlying consolidation in RIGHT lower lobe.

Little interval change.
